# Patient Record
Sex: Female | Born: 1985 | Race: Black or African American | Hispanic: No | Marital: Married | Smoking: Never smoker
Health system: Southern US, Community
[De-identification: ages and names within clinical notes are randomized; demographics above are authoritative.]

---

## 2007-09-28 ENCOUNTER — Inpatient Hospital Stay (HOSPITAL_COMMUNITY): Admission: EM | Admit: 2007-09-28 | Discharge: 2007-10-01 | Payer: Self-pay | Admitting: Emergency Medicine
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
## 2011-01-24 LAB — BASIC METABOLIC PANEL
BUN: 14
Chloride: 103
GFR calc non Af Amer: >60
Glucose, Bld: 116 — ABNORMAL HIGH
Potassium: 3.5
Sodium: 136

## 2011-01-24 LAB — POCT I-STAT, CHEM 8
BUN: 18
Calcium, Ion: 1.09 — ABNORMAL LOW
Chloride: 105
Creatinine, Ser: 1.1
TCO2: 18

## 2011-01-24 LAB — TYPE AND SCREEN: ABO/RH(D): O POS

## 2011-01-24 LAB — CBC
HCT: 35.5 — ABNORMAL LOW
HCT: 39.5
Hemoglobin: 12.1
Hemoglobin: 13.4
MCHC: 33.8
MCV: 93.9
MCV: 94.2
RBC: 4.2
RDW: 13.3
WBC: 14.7 — ABNORMAL HIGH

## 2011-01-24 LAB — URINE MICROSCOPIC-ADD ON

## 2011-01-24 LAB — ETHANOL: Alcohol, Ethyl (B): <5

## 2011-01-24 LAB — URINALYSIS, ROUTINE W REFLEX MICROSCOPIC
Bilirubin Urine: NEGATIVE
Ketones, ur: >80 — AB
Specific Gravity, Urine: 1.015
pH: 5.5

## 2011-01-24 LAB — PROTIME-INR: INR: 1

## 2017-01-05 ENCOUNTER — Encounter (HOSPITAL_COMMUNITY): Payer: Self-pay | Admitting: Emergency Medicine

## 2017-01-05 ENCOUNTER — Ambulatory Visit (HOSPITAL_COMMUNITY)
Admission: EM | Admit: 2017-01-05 | Discharge: 2017-01-05 | Disposition: A | Discharge status: Home / Self Care | Payer: Self-pay | Attending: Family Medicine | Admitting: Family Medicine

## 2017-01-05 DIAGNOSIS — Z3202 Encounter for pregnancy test, result negative: Secondary | ICD-10-CM | POA: Insufficient documentation

## 2017-01-05 DIAGNOSIS — N898 Other specified noninflammatory disorders of vagina: Secondary | ICD-10-CM | POA: Insufficient documentation

## 2017-01-05 DIAGNOSIS — A5901 Trichomonal vulvovaginitis: Secondary | ICD-10-CM | POA: Insufficient documentation

## 2017-01-05 DIAGNOSIS — B9689 Other specified bacterial agents as the cause of diseases classified elsewhere: Secondary | ICD-10-CM | POA: Insufficient documentation

## 2017-01-05 DIAGNOSIS — Z113 Encounter for screening for infections with a predominantly sexual mode of transmission: Secondary | ICD-10-CM

## 2017-01-05 DIAGNOSIS — B373 Candidiasis of vulva and vagina: Secondary | ICD-10-CM | POA: Insufficient documentation

## 2017-01-05 DIAGNOSIS — Z711 Person with feared health complaint in whom no diagnosis is made: Secondary | ICD-10-CM

## 2017-01-05 DIAGNOSIS — R8299 Other abnormal findings in urine: Secondary | ICD-10-CM

## 2017-01-05 LAB — POCT URINALYSIS DIP (DEVICE)
GLUCOSE, UA: NEGATIVE mg/dL
Hgb urine dipstick: NEGATIVE
Ketones, ur: NEGATIVE mg/dL
NITRITE: NEGATIVE
PH: 7 (ref 5.0–8.0)
PROTEIN: 30 mg/dL — AB
Specific Gravity, Urine: 1.02 (ref 1.005–1.030)
UROBILINOGEN UA: 4 mg/dL — AB (ref 0.0–1.0)

## 2017-01-05 LAB — POCT PREGNANCY, URINE: PREG TEST UR: NEGATIVE

## 2017-01-05 MED ORDER — CEFTRIAXONE SODIUM 250 MG IJ SOLR
250.0000 mg | Freq: Once | INTRAMUSCULAR | Status: AC
  Administered 2017-01-05: 250 mg via INTRAMUSCULAR

## 2017-01-05 MED ORDER — AZITHROMYCIN 250 MG PO TABS
1000.0000 mg | ORAL_TABLET | Freq: Once | ORAL | Status: AC
  Administered 2017-01-05: 1000 mg via ORAL

## 2017-01-05 MED ORDER — CEFTRIAXONE SODIUM 250 MG IJ SOLR
INTRAMUSCULAR | Status: AC
  Filled 2017-01-05: qty 250

## 2017-01-05 MED ORDER — LIDOCAINE HCL (PF) 1 % IJ SOLN
INTRAMUSCULAR | Status: AC
  Filled 2017-01-05: qty 2

## 2017-01-05 MED ORDER — AZITHROMYCIN 250 MG PO TABS
ORAL_TABLET | ORAL | Status: AC
  Filled 2017-01-05: qty 4

## 2017-01-05 NOTE — ED Triage Notes (Signed)
Pt c/o vaginal discharge x 2 days ago. Denies any burning or itching. Pt states "i would just like to get a shot that takes care of everything"

## 2017-01-05 NOTE — ED Provider Notes (Addendum)
[facility]    CSN: [id number] Arrival date & time: 01/05/17  1235     History   Chief Complaint Chief Complaint  Patient presents with  . SEXUALLY TRANSMITTED DISEASE    HPI Alexis Summers is a 31 y.o. female.   Presents today for STD testing. She reports vaginal discharge x 2 days that is yellowish and thin. She denies any urinary symptoms. She is sexually active with 1 partner. She would like to be treated empirically for STD. She declines HIV testing today. She declines Pelvic exam and would prefer self-Swab.       History reviewed. No pertinent past medical history.  There are no active problems to display for this patient.   History reviewed. No pertinent surgical history.  OB History    No data available       Home Medications    Prior to Admission medications   Not on File    Family History History reviewed. No pertinent family history.  Social History Social History  Substance Use Topics  . Smoking status: Never Smoker  . Smokeless tobacco: Never Used  . Alcohol use No     Allergies   Patient has no known allergies.   Review of Systems Review of Systems  Constitutional:       See HPI     Physical Exam Triage Vital Signs ED Triage Vitals [01/05/17 1325]  Enc Vitals Group     BP 99/67     Pulse Rate (!) 53     Resp 16     Temp 98.4 F (36.9 C)     Temp Source Oral     SpO2 100 %     Weight      Height      Head Circumference      Peak Flow      Pain Score      Pain Loc      Pain Edu?      Excl. in GC?    No data found.   Updated Vital Signs BP 99/67 (BP Location: Left Arm)   Pulse (!) 53   Temp 98.4 F (36.9 C) (Oral)   Resp 16   LMP 01/02/2017   SpO2 100%   Visual Acuity Right Eye Distance:   Left Eye Distance:   Bilateral Distance:    Right Eye Near:   Left Eye Near:    Bilateral Near:     Physical Exam  Constitutional: She appears well-developed and well-nourished.  Cardiovascular: Normal  rate and regular rhythm.   Pulmonary/Chest: Effort normal and breath sounds normal. She has no wheezes.  Abdominal: Soft. Bowel sounds are normal. There is no tenderness.  Genitourinary:  Genitourinary Comments: Declines  Skin: Skin is warm and dry.  Nursing note and vitals reviewed.    UC Treatments / Results  Labs (all labs ordered are listed, but only abnormal results are displayed) Labs Reviewed  POCT URINALYSIS DIP (DEVICE) - Abnormal; Notable for the following:       Result Value   Bilirubin Urine SMALL (*)    Protein, ur 30 (*)    Urobilinogen, UA 4.0 (*)    Leukocytes, UA TRACE (*)    All other components within normal limits  URINE CULTURE  POCT PREGNANCY, URINE  CERVICOVAGINAL ANCILLARY ONLY    EKG  EKG Interpretation None       Radiology No results found.  Procedures Procedures (including critical care time)  Medications Ordered in UC Medications  azithromycin (ZITHROMAX) tablet 1,000 mg (1,000 mg Oral Given 01/05/17 1347)  cefTRIAXone (ROCEPHIN) injection 250 mg (250 mg Intramuscular Given 01/05/17 1348)     Initial Impression / Assessment and Plan / UC Course  I have reviewed the triage vital signs and the nursing notes.  Pertinent labs & imaging results that were available during my care of the patient were reviewed by me and considered in my medical decision making (see chart for details).     Final Clinical Impressions(s) / UC Diagnoses   Final diagnoses:  Concern about sexually transmitted disease in female without diagnosis  UA has Trace of Leukocytes; she is asymptomatic; doubt UTI. Will send urine for culture.  Cytology pending.  Rocephin 250 mg IM and Azithromycin 1000 mg given today.  She declines HIV.  Education provided.   New Prescriptions There are no discharge medications for this patient.  Controlled Substance Prescriptions [place] Controlled Substance Registry consulted? Not Applicable      Lucia EstelleZheng, Wendel Homeyer, NP 01/05/17 1408

## 2017-01-07 ENCOUNTER — Telehealth (HOSPITAL_COMMUNITY): Payer: Self-pay | Admitting: Internal Medicine

## 2017-01-07 LAB — CERVICOVAGINAL ANCILLARY ONLY
BACTERIAL VAGINITIS: NEGATIVE
Candida vaginitis: POSITIVE — AB
Chlamydia: NEGATIVE
NEISSERIA GONORRHEA: NEGATIVE
Trichomonas: POSITIVE — AB

## 2017-01-07 MED ORDER — FLUCONAZOLE 150 MG PO TABS: 150.0000 mg | ORAL_TABLET | Freq: Once | ORAL | 0 refills | Status: AC

## 2017-01-07 MED ORDER — METRONIDAZOLE 500 MG PO TABS: 2000.0000 mg | ORAL_TABLET | Freq: Once | ORAL | 0 refills | Status: AC

## 2017-01-07 NOTE — Telephone Encounter (Signed)
Clinical staff, please let patient know that test for candida (yeast) was positive.  Rx fluconazole was sent to the pharmacy of record, [facility] [address]. Test for trichomonas was positive also.  Rx metronidazole was also sent to the pharmacy.  Please refrain from sexual intercourse for 7 days to give the medicine time to work.  Sexual partners need to be notified and tested/treated.  Condoms may reduce risk of reinfection.  Recheck or followup with PCP for further evaluation if symptoms are not improving.   LM

## 2017-07-22 ENCOUNTER — Encounter (HOSPITAL_COMMUNITY): Payer: Self-pay | Admitting: Emergency Medicine

## 2017-07-22 ENCOUNTER — Other Ambulatory Visit: Payer: Self-pay

## 2017-07-22 ENCOUNTER — Ambulatory Visit (HOSPITAL_COMMUNITY)
Admission: EM | Admit: 2017-07-22 | Discharge: 2017-07-22 | Disposition: A | Discharge status: Home / Self Care | Payer: Self-pay | Attending: Family Medicine | Admitting: Family Medicine

## 2017-07-22 DIAGNOSIS — S61412A Laceration without foreign body of left hand, initial encounter: Secondary | ICD-10-CM

## 2017-07-22 DIAGNOSIS — S61411A Laceration without foreign body of right hand, initial encounter: Secondary | ICD-10-CM

## 2017-07-22 NOTE — ED Provider Notes (Signed)
  [facility]   [id number] 07/22/17 Arrival Time: 1002  ASSESSMENT & PLAN:  1. Laceration of right hand without foreign body, initial encounter   2. Laceration of left hand without foreign body, initial encounter    Greater than 18 hours since injuries. Steri-strips applied to L thenar laceration and R 3rd finger laceration. Expect healing without complication. Work note give. Declines Td. Wound care instructions given. OTC analgesics as needed.  Reviewed expectations re: course of current medical issues. Questions answered. Outlined signs and symptoms indicating need for more acute intervention. Patient verbalized understanding. After Visit Summary given.   SUBJECTIVE:  Alexis Summers is a 32 y.o. female who presents with lacerations to her R and L hands. Last evening while cutting vegetables. "Knife slipped a couple of times." Unsure when last Td was. Minimal bleeding from wounds. Cleaned/washed at home. Minimal discomfort. No extremity sensation changes or weakness.  Td UTD: Unknown.  ROS: As per HPI.   OBJECTIVE:  Vitals:   07/22/17 1030  BP: 106/67  Pulse: (!) 59  Temp: 98.4 F (36.9 C)  TempSrc: Oral  SpO2: 100%     General appearance: alert; no distress Skin: laceration of L hand; size: approx 1 cm over thenar area; L thumb with FROM and strength; no active bleeding Skin: laceration of R 3rd and 4th distal dorsal fingers; size: approx 0.5 cm each; fingers with FROM and normal strength; no active bleeding Psychological: alert and cooperative; normal mood and affect  No Known Allergies   Social History   Socioeconomic History  . Marital status: Single    Spouse name: Not on file  . Number of children: Not on file  . Years of education: Not on file  . Highest education level: Not on file  Occupational History  . Not on file  Social Needs  . Financial resource strain: Not on file  . Food insecurity:    Worry: Not on file    Inability: Not on  file  . Transportation needs:    Medical: Not on file    Non-medical: Not on file  Tobacco Use  . Smoking status: Never Smoker  . Smokeless tobacco: Never Used  Substance and Sexual Activity  . Alcohol use: No  . Drug use: Not on file  . Sexual activity: Not on file  Lifestyle  . Physical activity:    Days per week: Not on file    Minutes per session: Not on file  . Stress: Not on file  Relationships  . Social connections:    Talks on phone: Not on file    Gets together: Not on file    Attends religious service: Not on file    Active member of club or organization: Not on file    Attends meetings of clubs or organizations: Not on file    Relationship status: Not on file  Other Topics Concern  . Not on file  Social History Narrative  . Not on file         Mardella LaymanHagler, Christyl Osentoski, MD 07/23/17 1030

## 2017-07-22 NOTE — ED Notes (Signed)
Non-adherent bandages applied to left thumb and right hand second and third digit and wrapped with coban. Pt tolerated well.

## 2017-07-22 NOTE — Discharge Instructions (Addendum)
Keep your wound clean and as dry as possible. Do not immerse or soak your wound in water. This means no swimming, washing dishes (unless thick rubber gloves are used), baths, or hot tubs until the steri-strips come off.

## 2017-07-22 NOTE — ED Triage Notes (Signed)
States while cutting vegetables yesterday cut both hands with kitchen knife, last Td, "years ago"

## 2017-12-24 ENCOUNTER — Other Ambulatory Visit: Payer: Self-pay

## 2017-12-24 ENCOUNTER — Encounter (HOSPITAL_COMMUNITY): Payer: Self-pay | Admitting: Emergency Medicine

## 2017-12-24 ENCOUNTER — Ambulatory Visit (HOSPITAL_COMMUNITY)
Admission: EM | Admit: 2017-12-24 | Discharge: 2017-12-24 | Disposition: A | Discharge status: Home / Self Care | Payer: Self-pay | Attending: Family Medicine | Admitting: Family Medicine

## 2017-12-24 DIAGNOSIS — N3090 Cystitis, unspecified without hematuria: Secondary | ICD-10-CM | POA: Insufficient documentation

## 2017-12-24 DIAGNOSIS — Z3202 Encounter for pregnancy test, result negative: Secondary | ICD-10-CM

## 2017-12-24 DIAGNOSIS — Z113 Encounter for screening for infections with a predominantly sexual mode of transmission: Secondary | ICD-10-CM

## 2017-12-24 DIAGNOSIS — Z202 Contact with and (suspected) exposure to infections with a predominantly sexual mode of transmission: Secondary | ICD-10-CM | POA: Insufficient documentation

## 2017-12-24 LAB — POCT URINALYSIS DIP (DEVICE)
Bilirubin Urine: NEGATIVE
GLUCOSE, UA: NEGATIVE mg/dL
KETONES UR: NEGATIVE mg/dL
NITRITE: NEGATIVE
PROTEIN: NEGATIVE mg/dL
Specific Gravity, Urine: 1.02 (ref 1.005–1.030)
Urobilinogen, UA: 0.2 mg/dL (ref 0.0–1.0)
pH: 7 (ref 5.0–8.0)

## 2017-12-24 LAB — POCT PREGNANCY, URINE: PREG TEST UR: NEGATIVE

## 2017-12-24 MED ORDER — LIDOCAINE HCL (PF) 1 % IJ SOLN
INTRAMUSCULAR | Status: AC
  Filled 2017-12-24: qty 2

## 2017-12-24 MED ORDER — AZITHROMYCIN 250 MG PO TABS
ORAL_TABLET | ORAL | Status: AC
  Filled 2017-12-24: qty 4

## 2017-12-24 MED ORDER — CEFTRIAXONE SODIUM 250 MG IJ SOLR
INTRAMUSCULAR | Status: AC
  Filled 2017-12-24: qty 250

## 2017-12-24 MED ORDER — CEPHALEXIN 500 MG PO CAPS: 500.0000 mg | ORAL_CAPSULE | Freq: Two times a day (BID) | ORAL | 0 refills | Status: DC

## 2017-12-24 MED ORDER — CEFTRIAXONE SODIUM 250 MG IJ SOLR
250.0000 mg | Freq: Once | INTRAMUSCULAR | Status: AC
  Administered 2017-12-24: 250 mg via INTRAMUSCULAR

## 2017-12-24 MED ORDER — AZITHROMYCIN 250 MG PO TABS
1000.0000 mg | ORAL_TABLET | Freq: Once | ORAL | Status: AC
  Administered 2017-12-24: 1000 mg via ORAL

## 2017-12-24 NOTE — ED Triage Notes (Signed)
Complains of pressure and is concerned for uti.  Patient says she gets " paranoid"  and concerned for a uti.  History of the same.  This started saturday

## 2017-12-24 NOTE — Discharge Instructions (Signed)
You have been given the following medications today for treatment of suspected gonorrhea and/or chlamydia:  cefTRIAXone (ROCEPHIN) injection 250 mg azithromycin (ZITHROMAX) tablet 1,000 mg  Even though we have treated you today, we have sent testing for sexually transmitted infections. We will notify you of any positive results once they are received. If required, we will prescribe any medications you might need.  Please refrain from all sexual activity for at least the next seven days.  We have also sent a urine culture and will inform you of any positive results.

## 2017-12-25 LAB — CERVICOVAGINAL ANCILLARY ONLY
Bacterial vaginitis: POSITIVE — AB
CHLAMYDIA, DNA PROBE: NEGATIVE
Candida vaginitis: POSITIVE — AB
NEISSERIA GONORRHEA: NEGATIVE
Trichomonas: NEGATIVE

## 2017-12-25 NOTE — ED Provider Notes (Signed)
[facility]    ASSESSMENT & PLAN:  1. Cystitis   2. Possible exposure to STD    Desires STD testing and empiric treatment.  Meds ordered this encounter  Medications  . azithromycin (ZITHROMAX) tablet 1,000 mg  . cefTRIAXone (ROCEPHIN) injection 250 mg  . cephALEXin (KEFLEX) 500 MG capsule    Sig: Take 1 capsule (500 mg total) by mouth 2 (two) times daily.    Dispense:  10 capsule    Refill:  0   Urine culture sent. Will notify patient when results available. Will follow up with her PCP or here if not showing improvement over the next 48 hours, sooner if needed.  Outlined signs and symptoms indicating need for more acute intervention. Patient verbalized understanding. After Visit Summary given.  SUBJECTIVE:  Alexis Summers is a 32 y.o. female who complains of urinary frequency, urgency and dysuria for the past 2 days. No flank pain, fever, chills, abnormal vaginal discharge or bleeding. Hematuria: not present. Normal PO intake. No abdominal pain. No self treatment. Ambulatory without difficulty. Desires STD testing.  LMP: Patient's last menstrual period was 12/18/2017.  ROS: As in HPI.  OBJECTIVE:  Vitals:   12/24/17 1641  BP: 115/67  Pulse: 76  Resp: 16  Temp: 98.2 F (36.8 C)  TempSrc: Oral  SpO2: 100%   Appears well, in no apparent distress. Abdomen is soft without tenderness, guarding, mass, rebound or organomegaly. No CVA tenderness or inguinal adenopathy noted.  Labs Reviewed  POCT URINALYSIS DIP (DEVICE) - Abnormal; Notable for the following components:      Result Value   Hgb urine dipstick SMALL (*)    Leukocytes, UA SMALL (*)    All other components within normal limits  URINE CULTURE  POCT PREGNANCY, URINE  CERVICOVAGINAL ANCILLARY ONLY    No Known Allergies  History reviewed. No pertinent past medical history. Social History   Socioeconomic History  . Marital status: Single    Spouse name: Not on file  . Number of children: Not  on file  . Years of education: Not on file  . Highest education level: Not on file  Occupational History  . Not on file  Social Needs  . Financial resource strain: Not on file  . Food insecurity:    Worry: Not on file    Inability: Not on file  . Transportation needs:    Medical: Not on file    Non-medical: Not on file  Tobacco Use  . Smoking status: Never Smoker  . Smokeless tobacco: Never Used  Substance and Sexual Activity  . Alcohol use: No  . Drug use: Never  . Sexual activity: Not on file  Lifestyle  . Physical activity:    Days per week: Not on file    Minutes per session: Not on file  . Stress: Not on file  Relationships  . Social connections:    Talks on phone: Not on file    Gets together: Not on file    Attends religious service: Not on file    Active member of club or organization: Not on file    Attends meetings of clubs or organizations: Not on file    Relationship status: Not on file  . Intimate partner violence:    Fear of current or ex partner: Not on file    Emotionally abused: Not on file    Physically abused: Not on file    Forced sexual activity: Not on file  Other Topics Concern  .  Not on file  Social History Narrative  . Not on file   Family History  Problem Relation Age of Onset  . Healthy Mother   . Healthy Father        Mardella LaymanHagler, Refael Fulop, MD 12/25/17 1013

## 2017-12-26 ENCOUNTER — Telehealth: Payer: Self-pay | Admitting: Emergency Medicine

## 2017-12-26 ENCOUNTER — Telehealth (HOSPITAL_COMMUNITY): Payer: Self-pay

## 2017-12-26 MED ORDER — METRONIDAZOLE 500 MG PO TABS: 500.0000 mg | ORAL_TABLET | Freq: Two times a day (BID) | ORAL | 0 refills | Status: DC

## 2017-12-26 MED ORDER — FLUCONAZOLE 150 MG PO TABS: 150.0000 mg | ORAL_TABLET | Freq: Every day | ORAL | 0 refills | Status: AC

## 2017-12-26 NOTE — Telephone Encounter (Signed)
Patient returned Alexis Amendourtney, RN's call re: lab results. I advised patient of lab results as per result note. Patient agreed and voiced understanding of results.

## 2017-12-26 NOTE — Telephone Encounter (Signed)
Urine culture positive for gram negative rods, pt was given Keflex. Awaiting sensitivities.   Bacterial vaginosis is positive. This was not treated at the urgent care visit.  Flagyl 500 mg BID x 7 days #14 no refills sent to patients pharmacy of choice.    Pt contacted regarding test for candida (yeast) was positive.  Prescription for fluconazole 150mg  po now, repeat dose in 3d if needed, #2 no refills, sent to the pharmacy of record.    Attempted to reach patient. No answer. Voicemail left.

## 2017-12-27 LAB — URINE CULTURE: Culture: >=100000 — AB

## 2017-12-30 ENCOUNTER — Telehealth: Payer: Self-pay

## 2017-12-30 NOTE — Telephone Encounter (Signed)
Urine culture positive for E.Coli. This was treated at ucc with Keflex. Attempted to reach patient x 2. No answer.

## 2020-06-29 ENCOUNTER — Encounter: Payer: Self-pay | Admitting: Family Medicine

## 2020-08-03 ENCOUNTER — Ambulatory Visit (INDEPENDENT_AMBULATORY_CARE_PROVIDER_SITE_OTHER): Payer: 59 | Admitting: Family Medicine

## 2020-08-03 ENCOUNTER — Encounter: Payer: Self-pay | Admitting: Family Medicine

## 2020-08-03 ENCOUNTER — Other Ambulatory Visit: Payer: Self-pay

## 2020-08-03 ENCOUNTER — Other Ambulatory Visit (HOSPITAL_COMMUNITY)
Admission: RE | Admit: 2020-08-03 | Discharge: 2020-08-03 | Disposition: A | Discharge status: Home / Self Care | Payer: 59 | Source: Ambulatory Visit | Attending: Family Medicine | Admitting: Family Medicine

## 2020-08-03 VITALS — BP 106/65 | HR 59 | Wt 169.0 lb

## 2020-08-03 DIAGNOSIS — N898 Other specified noninflammatory disorders of vagina: Secondary | ICD-10-CM | POA: Diagnosis present

## 2020-08-03 DIAGNOSIS — Z01419 Encounter for gynecological examination (general) (routine) without abnormal findings: Secondary | ICD-10-CM | POA: Diagnosis present

## 2020-08-03 NOTE — Progress Notes (Signed)
GYNECOLOGY ANNUAL PREVENTATIVE CARE ENCOUNTER NOTE  Subjective:   Alexis Summers is a 35 y.o. G0P0000 female here for a routine annual gynecologic exam.  Current complaints: abnormal discharge. Gets vaginal discharge with odor every month or 2.    Denies abnormal vaginal bleeding, discharge, pelvic pain, problems with intercourse or other gynecologic concerns.    Gynecologic History No LMP recorded. Patient has had an injection. Patient is sexually active  Contraception: Depo-Provera injections Last Pap: unsure. Results were: normal Last mammogram: n/a.  Obstetric History OB History  Gravida Para Term Preterm AB Living  0 0 0 0 0 0  SAB IAB Ectopic Multiple Live Births  0 0 0 0 0    History reviewed. No pertinent past medical history.  History reviewed. No pertinent surgical history.  Current Outpatient Medications on File Prior to Visit  Medication Sig Dispense Refill  . citalopram (CELEXA) 40 MG tablet Take 40 mg by mouth daily.    . cephALEXin (KEFLEX) 500 MG capsule Take 1 capsule (500 mg total) by mouth 2 (two) times daily. (Patient not taking: Reported on 08/03/2020) 10 capsule 0  . metroNIDAZOLE (FLAGYL) 500 MG tablet Take 1 tablet (500 mg total) by mouth 2 (two) times daily. (Patient not taking: Reported on 08/03/2020) 14 tablet 0   No current facility-administered medications on file prior to visit.    No Known Allergies  Social History   Socioeconomic History  . Marital status: Single    Spouse name: Not on file  . Number of children: Not on file  . Years of education: Not on file  . Highest education level: Not on file  Occupational History  . Not on file  Tobacco Use  . Smoking status: Never Smoker  . Smokeless tobacco: Never Used  Vaping Use  . Vaping Use: Never used  Substance and Sexual Activity  . Alcohol use: No  . Drug use: Never  . Sexual activity: Not on file  Other Topics Concern  . Not on file  Social History Narrative  . Not on file    Social Determinants of Health   Financial Resource Strain: Not on file  Food Insecurity: Not on file  Transportation Needs: Not on file  Physical Activity: Not on file  Stress: Not on file  Social Connections: Not on file  Intimate Partner Violence: Not on file    Family History  Problem Relation Age of Onset  . Healthy Mother   . Healthy Father     The following portions of the patient's history were reviewed and updated as appropriate: allergies, current medications, past family history, past medical history, past social history, past surgical history and problem list.  Review of Systems Pertinent items are noted in HPI.   Objective:  BP 106/65   Pulse (!) 59   Wt 169 lb (76.7 kg)  Wt Readings from Last 3 Encounters:  08/03/20 169 lb (76.7 kg)     Chaperone present during exam  CONSTITUTIONAL: Well-developed, well-nourished female in no acute distress.  HENT:  Normocephalic, atraumatic, External right and left ear normal. Oropharynx is clear and moist EYES: Conjunctivae and EOM are normal. Pupils are equal, round, and reactive to light. No scleral icterus.  NECK: Normal range of motion, supple, no masses.  Normal thyroid.   CARDIOVASCULAR: Normal heart rate noted, regular rhythm RESPIRATORY: Clear to auscultation bilaterally. Effort and breath sounds normal, no problems with respiration noted. BREASTS: Symmetric in size. No masses, skin changes, nipple drainage, or  lymphadenopathy. ABDOMEN: Soft, normal bowel sounds, no distention noted.  No tenderness, rebound or guarding.  PELVIC: Normal appearing external genitalia; normal appearing vaginal mucosa and cervix.  No abnormal discharge noted.  Normal uterine size, no other palpable masses, no uterine or adnexal tenderness. MUSCULOSKELETAL: Normal range of motion. No tenderness.  No cyanosis, clubbing, or edema.  2+ distal pulses. SKIN: Skin is warm and dry. No rash noted. Not diaphoretic. No erythema. No  pallor. NEUROLOGIC: Alert and oriented to person, place, and time. Normal reflexes, muscle tone coordination. No cranial nerve deficit noted. PSYCHIATRIC: Normal mood and affect. Normal behavior. Normal judgment and thought content.  Assessment:  Annual gynecologic examination with pap smear   Plan:  1. Well Woman Exam Will follow up results of pap smear and manage accordingly. STD testing discussed. Patient requested testing - Cytology - PAP( [place])  2. Vaginal discharge Discussed triggers for BV - tight clothes, soaps, lotions with odor, sex without condoms, etc. May need suppression. Will follow vaginal culture - Cervicovaginal ancillary only( [place])   Routine preventative health maintenance measures emphasized. Please refer to After Visit Summary for other counseling recommendations.    Candelaria Celeste, DO [facility]

## 2020-08-05 LAB — CYTOLOGY - PAP
Comment: NEGATIVE
Diagnosis: NEGATIVE
High risk HPV: NEGATIVE

## 2020-08-05 LAB — CERVICOVAGINAL ANCILLARY ONLY
Bacterial Vaginitis (gardnerella): POSITIVE — AB
Candida Glabrata: NEGATIVE
Candida Vaginitis: NEGATIVE
Comment: NEGATIVE
Comment: NEGATIVE
Comment: NEGATIVE

## 2020-08-08 ENCOUNTER — Telehealth: Payer: Self-pay

## 2020-08-08 MED ORDER — METRONIDAZOLE 500 MG PO TABS: 500.0000 mg | ORAL_TABLET | Freq: Two times a day (BID) | ORAL | 0 refills | Status: DC

## 2020-08-08 NOTE — Telephone Encounter (Signed)
Patient called and received her pap smear results. Patient made aware that she does have trichomonas on her pap smear. Explained this is an STD and she will need to be treated. Explained she needs to avoid alcohol with her treatment and that her partner needs to be tested and /or treated. They must abstain from intercourse until treatment is completed. Patient states understanding. Armandina Stammer RN

## 2020-08-30 ENCOUNTER — Telehealth: Payer: Self-pay | Admitting: General Practice

## 2020-08-30 NOTE — Telephone Encounter (Signed)
Left message on voice mail for patient to give our office a call to schedule 3 month follow up for the month of July 2023.

## 2020-11-29 ENCOUNTER — Ambulatory Visit (HOSPITAL_COMMUNITY): Payer: 59 | Admitting: Clinical

## 2021-07-12 DIAGNOSIS — Z01411 Encounter for gynecological examination (general) (routine) with abnormal findings: Secondary | ICD-10-CM | POA: Diagnosis not present

## 2021-07-12 DIAGNOSIS — R87810 Cervical high risk human papillomavirus (HPV) DNA test positive: Secondary | ICD-10-CM | POA: Diagnosis not present

## 2021-07-12 DIAGNOSIS — N898 Other specified noninflammatory disorders of vagina: Secondary | ICD-10-CM | POA: Diagnosis not present

## 2021-07-12 DIAGNOSIS — Z202 Contact with and (suspected) exposure to infections with a predominantly sexual mode of transmission: Secondary | ICD-10-CM | POA: Diagnosis not present

## 2021-07-12 DIAGNOSIS — Z1151 Encounter for screening for human papillomavirus (HPV): Secondary | ICD-10-CM | POA: Diagnosis not present

## 2021-07-12 DIAGNOSIS — Z124 Encounter for screening for malignant neoplasm of cervix: Secondary | ICD-10-CM | POA: Diagnosis not present

## 2021-07-12 DIAGNOSIS — Z130 Encounter for screening for diseases of the blood and blood-forming organs and certain disorders involving the immune mechanism: Secondary | ICD-10-CM | POA: Diagnosis not present

## 2021-08-01 DIAGNOSIS — Z23 Encounter for immunization: Secondary | ICD-10-CM | POA: Diagnosis not present

## 2021-10-02 DIAGNOSIS — Z23 Encounter for immunization: Secondary | ICD-10-CM | POA: Diagnosis not present

## 2022-01-03 DIAGNOSIS — Z113 Encounter for screening for infections with a predominantly sexual mode of transmission: Secondary | ICD-10-CM | POA: Diagnosis not present

## 2022-01-03 DIAGNOSIS — N765 Ulceration of vagina: Secondary | ICD-10-CM | POA: Diagnosis not present

## 2022-02-12 ENCOUNTER — Encounter: Payer: Self-pay | Admitting: Nurse Practitioner

## 2022-02-12 ENCOUNTER — Ambulatory Visit: Payer: BC Managed Care – PPO | Admitting: Nurse Practitioner

## 2022-02-12 VITALS — BP 100/70 | HR 76 | Temp 98.1°F | Ht 72.0 in | Wt 156.6 lb

## 2022-02-12 DIAGNOSIS — Z23 Encounter for immunization: Secondary | ICD-10-CM

## 2022-02-12 DIAGNOSIS — Z2821 Immunization not carried out because of patient refusal: Secondary | ICD-10-CM

## 2022-02-12 DIAGNOSIS — R200 Anesthesia of skin: Secondary | ICD-10-CM | POA: Diagnosis not present

## 2022-02-12 DIAGNOSIS — Z0000 Encounter for general adult medical examination without abnormal findings: Secondary | ICD-10-CM | POA: Diagnosis not present

## 2022-02-12 DIAGNOSIS — Z114 Encounter for screening for human immunodeficiency virus [HIV]: Secondary | ICD-10-CM

## 2022-02-12 DIAGNOSIS — Z1159 Encounter for screening for other viral diseases: Secondary | ICD-10-CM | POA: Diagnosis not present

## 2022-02-12 DIAGNOSIS — Z0001 Encounter for general adult medical examination with abnormal findings: Secondary | ICD-10-CM | POA: Diagnosis not present

## 2022-02-12 NOTE — Progress Notes (Signed)
I,Tianna Badgett,acting as a Education administrator for [employer], FNP.,have documented all relevant documentation on the behalf of Minette Brine, FNP,as directed by  Minette Brine, FNP while in the presence of Minette Brine, [place].  Subjective:     Patient ID: Alexis Summers , female    DOB: 1985-11-14 , 36 y.o.   MRN: [id number]   Chief Complaint  Patient presents with   Establish Care    HPI  Patient presents today to establish care. She was referred to [place]. She has not had a PCP in more than 5 years. She is followed by Elray Mcgregor at [facility]. She works in Therapist, art. Single. No children.   PMH - none  She would like a physical. She also reports tingling in her hands. Has been ongoing for years.     History reviewed. No pertinent past medical history.   Family History  Problem Relation Age of Onset   Hypertension Mother    Healthy Mother    Healthy Father      Current Outpatient Medications:    cephALEXin (KEFLEX) 500 MG capsule, Take 1 capsule (500 mg total) by mouth 2 (two) times daily. (Patient not taking: Reported on 08/03/2020), Disp: 10 capsule, Rfl: 0   citalopram (CELEXA) 40 MG tablet, Take 40 mg by mouth daily. (Patient not taking: Reported on 02/12/2022), Disp: , Rfl:    metroNIDAZOLE (FLAGYL) 500 MG tablet, Take 1 tablet (500 mg total) by mouth 2 (two) times daily. (Patient not taking: Reported on 08/03/2020), Disp: 14 tablet, Rfl: 0   metroNIDAZOLE (FLAGYL) 500 MG tablet, Take 1 tablet (500 mg total) by mouth 2 (two) times daily. (Patient not taking: Reported on 02/12/2022), Disp: 14 tablet, Rfl: 0   No Known Allergies    The patient states she is none for birth control.  Patient's last menstrual period was 01/25/2022 (approximate).. Negative for Dysmenorrhea and Negative for Menorrhagia. Negative for: breast discharge, breast lump(s), breast pain and breast self exam. Associated symptoms include abnormal vaginal bleeding. Pertinent negatives include  abnormal bleeding (hematology), anxiety, decreased libido, depression, difficulty falling sleep, dyspareunia, history of infertility, nocturia, sexual dysfunction, sleep disturbances, urinary incontinence, urinary urgency, vaginal discharge and vaginal itching. Diet regular.The patient states her exercise level is   The patient's tobacco use is:  Social History   Tobacco Use  Smoking Status Never  Smokeless Tobacco Never  . She has been exposed to passive smoke. The patient's alcohol use is:  Social History   Substance and Sexual Activity  Alcohol Use Yes   Alcohol/week: 2.0 standard drinks of alcohol   Types: 2 Glasses of wine per week   Comment: everyday   Additional information: Last pap 08/03/2020, next one scheduled for 08/04/2023.    Review of Systems  Constitutional: Negative.   HENT: Negative.    Eyes: Negative.   Respiratory: Negative.    Cardiovascular: Negative.   Gastrointestinal: Negative.   Endocrine: Negative.   Genitourinary: Negative.   Musculoskeletal: Negative.   Skin: Negative.   Allergic/Immunologic: Negative.   Neurological:  Positive for numbness (fingertips, random times - mainly when it is cold but has occurred during the warm seasons).  Hematological: Negative.   Psychiatric/Behavioral: Negative.       Today's Vitals   02/12/22 0920  BP: 100/70  Pulse: 76  Temp: 98.1 F (36.7 C)  TempSrc: Oral  Weight: 156 lb 9.6 oz (71 kg)  Height: 6' (1.829 m)   Body mass index is 21.24 kg/m.   Objective:  Physical Exam Vitals reviewed.  Constitutional:      General: She is not in acute distress.    Appearance: Normal appearance. She is well-developed.  HENT:     Head: Normocephalic and atraumatic.     Right Ear: Hearing, tympanic membrane, ear canal and external ear normal. There is no impacted cerumen.     Left Ear: Hearing, tympanic membrane, ear canal and external ear normal. There is no impacted cerumen.     Nose: Nose normal.     Mouth/Throat:      Mouth: Mucous membranes are dry.  Eyes:     General: Lids are normal.     Extraocular Movements: Extraocular movements intact.     Conjunctiva/sclera: Conjunctivae normal.     Pupils: Pupils are equal, round, and reactive to light.     Funduscopic exam:    Right eye: No papilledema.        Left eye: No papilledema.  Neck:     Thyroid: No thyroid mass.     Vascular: No carotid bruit.  Cardiovascular:     Rate and Rhythm: Normal rate and regular rhythm.     Pulses: Normal pulses.     Heart sounds: Normal heart sounds. No murmur heard. Pulmonary:     Effort: Pulmonary effort is normal.     Breath sounds: Normal breath sounds.  Chest:     Chest wall: No mass.  Breasts:    Tanner Score is 5.     Right: Normal. No mass or tenderness.     Left: Normal. No mass or tenderness.  Abdominal:     General: Abdomen is flat. Bowel sounds are normal. There is no distension.     Palpations: Abdomen is soft.     Tenderness: There is no abdominal tenderness.  Genitourinary:    Comments: [facility] by [facility] Musculoskeletal:        General: No swelling or tenderness. Normal range of motion.     Cervical back: Full passive range of motion without pain, normal range of motion and neck supple.     Right lower leg: No edema.     Left lower leg: No edema.  Lymphadenopathy:     Upper Body:     Right upper body: No supraclavicular, axillary or pectoral adenopathy.     Left upper body: No supraclavicular, axillary or pectoral adenopathy.  Skin:    General: Skin is warm and dry.     Capillary Refill: Capillary refill takes less than 2 seconds.  Neurological:     General: No focal deficit present.     Mental Status: She is alert and oriented to person, place, and time.     Cranial Nerves: No cranial nerve deficit.     Sensory: No sensory deficit.     Motor: No weakness.  Psychiatric:        Mood and Affect: Mood normal.        Behavior: Behavior normal.        Thought  Content: Thought content normal.        Judgment: Judgment normal.         Assessment And Plan:     1. Encounter for medical examination to establish care Behavior modifications discussed and diet history reviewed.   Pt will continue to exercise regularly and modify diet with low GI, plant based foods and decrease intake of processed foods.  Recommend intake of daily multivitamin, Vitamin D, and calcium.  Recommend mammogram and colonoscopy for  preventive screenings, as well as recommend immunizations that include influenza (declined), TDAP (given in office) - CBC - Hemoglobin A1c - CMP14+EGFR - Lipid panel - VITAMIN D 25 Hydroxy (Vit-D Deficiency, Fractures)  2. Encounter for hepatitis C screening test for low risk patient Will check Hepatitis C screening due to recent recommendations to screen all adults 18 years and older - Hepatitis C antibody  3. Screening for HIV without presence of risk factors - HIV Antibody (routine testing w rflx)  4. Need for Tdap vaccination Will give tetanus vaccine today while in office. Refer to order management. TDAP will be administered to adults 35-26 years old every 10 years.  5. Influenza vaccination declined Patient declined influenza vaccination at this time. Patient is aware that influenza vaccine prevents illness in 70% of healthy people, and reduces hospitalizations to 30-70% in elderly. This vaccine is recommended annually. Education has been provided regarding the importance of this vaccine but patient still declined. Advised may receive this vaccine at local pharmacy or Health Dept.or vaccine clinic. Aware to provide a copy of the vaccination record if obtained from local pharmacy or Health Dept.  Pt is willing to accept risk associated with refusing vaccination.  6. COVID-19 vaccination declined Declines covid 19 vaccine. Discussed risk of covid 67 and if she changes her mind about the vaccine to call the office. Education has been  provided regarding the importance of this vaccine but patient still declined. Advised may receive this vaccine at local pharmacy or Health Dept.or vaccine clinic. Aware to provide a copy of the vaccination record if obtained from local pharmacy or Health Dept.  Encouraged to take multivitamin, vitamin d, vitamin c and zinc to increase immune system. Aware can call office if would like to have vaccine here at office. Verbalized acceptance and understanding.  5. Numbness Comments: No abnormal findings this visit. DDX - carpal tunnel vs metabolic discorder vs vitamin B12 defieciency vs Raynauds (since has fingertips discoloration with episode).  - Vitamin B12 - TSH   Patient was given opportunity to ask questions. Patient verbalized understanding of the plan and was able to repeat key elements of the plan. All questions were answered to their satisfaction.   Minette Brine, FNP   I, Minette Brine, FNP, have reviewed all documentation for this visit. The documentation on 02/12/22 for the exam, diagnosis, procedures, and orders are all accurate and complete.   THE PATIENT IS ENCOURAGED TO PRACTICE SOCIAL DISTANCING DUE TO THE COVID-19 PANDEMIC.

## 2022-02-12 NOTE — Patient Instructions (Addendum)
Health Maintenance, Female Adopting a healthy lifestyle and getting preventive care are important in promoting health and wellness. Ask your health care provider about: The right schedule for you to have regular tests and exams. Things you can do on your own to prevent diseases and keep yourself healthy. What should I know about diet, weight, and exercise? Eat a healthy diet  Eat a diet that includes plenty of vegetables, fruits, low-fat dairy products, and lean protein. Do not eat a lot of foods that are high in solid fats, added sugars, or sodium. Maintain a healthy weight Body mass index (BMI) is used to identify weight problems. It estimates body fat based on height and weight. Your health care provider can help determine your BMI and help you achieve or maintain a healthy weight. Get regular exercise Get regular exercise. This is one of the most important things you can do for your health. Most adults should: Exercise for at least 150 minutes each week. The exercise should increase your heart rate and make you sweat (moderate-intensity exercise). Do strengthening exercises at least twice a week. This is in addition to the moderate-intensity exercise. Spend less time sitting. Even light physical activity can be beneficial. Watch cholesterol and blood lipids Have your blood tested for lipids and cholesterol at 36 years of age, then have this test every 5 years. Have your cholesterol levels checked more often if: Your lipid or cholesterol levels are high. You are older than 36 years of age. You are at high risk for heart disease. What should I know about cancer screening? Depending on your health history and family history, you may need to have cancer screening at various ages. This may include screening for: Breast cancer. Cervical cancer. Colorectal cancer. Skin cancer. Lung cancer. What should I know about heart disease, diabetes, and high blood pressure? Blood pressure and heart  disease High blood pressure causes heart disease and increases the risk of stroke. This is more likely to develop in people who have high blood pressure readings or are overweight. Have your blood pressure checked: Every 3-5 years if you are 18-39 years of age. Every year if you are 40 years old or older. Diabetes Have regular diabetes screenings. This checks your fasting blood sugar level. Have the screening done: Once every three years after age 40 if you are at a normal weight and have a low risk for diabetes. More often and at a younger age if you are overweight or have a high risk for diabetes. What should I know about preventing infection? Hepatitis B If you have a higher risk for hepatitis B, you should be screened for this virus. Talk with your health care provider to find out if you are at risk for hepatitis B infection. Hepatitis C Testing is recommended for: Everyone born from 1945 through 1965. Anyone with known risk factors for hepatitis C. Sexually transmitted infections (STIs) Get screened for STIs, including gonorrhea and chlamydia, if: You are sexually active and are younger than 36 years of age. You are older than 36 years of age and your health care provider tells you that you are at risk for this type of infection. Your sexual activity has changed since you were last screened, and you are at increased risk for chlamydia or gonorrhea. Ask your health care provider if you are at risk. Ask your health care provider about whether you are at high risk for HIV. Your health care provider may recommend a prescription medicine to help prevent HIV   infection. If you choose to take medicine to prevent HIV, you should first get tested for HIV. You should then be tested every 3 months for as long as you are taking the medicine. Pregnancy If you are about to stop having your period (premenopausal) and you may become pregnant, seek counseling before you get pregnant. Take 400 to 800  micrograms (mcg) of folic acid every day if you become pregnant. Ask for birth control (contraception) if you want to prevent pregnancy. Osteoporosis and menopause Osteoporosis is a disease in which the bones lose minerals and strength with aging. This can result in bone fractures. If you are 65 years old or older, or if you are at risk for osteoporosis and fractures, ask your health care provider if you should: Be screened for bone loss. Take a calcium or vitamin D supplement to lower your risk of fractures. Be given hormone replacement therapy (HRT) to treat symptoms of menopause. Follow these instructions at home: Alcohol use Do not drink alcohol if: Your health care provider tells you not to drink. You are pregnant, may be pregnant, or are planning to become pregnant. If you drink alcohol: Limit how much you have to: 0-1 drink a day. Know how much alcohol is in your drink. In the [place] one drink equals one 12 oz bottle of beer (355 mL), one 5 oz glass of wine (148 mL), or one 1 oz glass of hard liquor (44 mL). Lifestyle Do not use any products that contain nicotine or tobacco. These products include cigarettes, chewing tobacco, and vaping devices, such as e-cigarettes. If you need help quitting, ask your health care provider. Do not use street drugs. Do not share needles. Ask your health care provider for help if you need support or information about quitting drugs. General instructions Schedule regular health, dental, and eye exams. Stay current with your vaccines. Tell your health care provider if: You often feel depressed. You have ever been abused or do not feel safe at home. Summary Adopting a healthy lifestyle and getting preventive care are important in promoting health and wellness. Follow your health care provider's instructions about healthy diet, exercising, and getting tested or screened for diseases. Follow your health care provider's instructions on monitoring your  cholesterol and blood pressure. This information is not intended to replace advice given to you by your health care provider. Make sure you discuss any questions you have with your health care provider. Document Revised: 09/05/2020 Document Reviewed: 09/05/2020 Elsevier Patient Education  2023 Elsevier Inc.   Tdap (Tetanus, Diphtheria, Pertussis) Vaccine: What You Need to Know 1. Why get vaccinated? Tdap vaccine can prevent tetanus, diphtheria, and pertussis. Diphtheria and pertussis spread from person to person. Tetanus enters the body through cuts or wounds. TETANUS (T) causes painful stiffening of the muscles. Tetanus can lead to serious health problems, including being unable to open the mouth, having trouble swallowing and breathing, or death. DIPHTHERIA (D) can lead to difficulty breathing, heart failure, paralysis, or death. PERTUSSIS (aP), also known as "whooping cough," can cause uncontrollable, violent coughing that makes it hard to breathe, eat, or drink. Pertussis can be extremely serious especially in babies and [place] children, causing pneumonia, convulsions, brain damage, or death. In teens and adults, it can cause weight loss, loss of bladder control, passing out, and rib fractures from severe coughing. 2. Tdap vaccine Tdap is only for children 7 years and older, adolescents, and adults.  Adolescents should receive a single dose of Tdap, preferably at age 11   or 12 years. Pregnant people should get a dose of Tdap during every pregnancy, preferably during the early part of the third trimester, to help protect the newborn from pertussis. Infants are most at risk for severe, life-threatening complications from pertussis. Adults who have never received Tdap should get a dose of Tdap. Also, adults should receive a booster dose of either Tdap or Td (a different vaccine that protects against tetanus and diphtheria but not pertussis) every 10 years, or after 5 years in the case of a severe  or dirty wound or burn. Tdap may be given at the same time as other vaccines. 3. Talk with your health care provider Tell your vaccine provider if the person getting the vaccine: Has had an allergic reaction after a previous dose of any vaccine that protects against tetanus, diphtheria, or pertussis, or has any severe, life-threatening allergies Has had a coma, decreased level of consciousness, or prolonged seizures within 7 days after a previous dose of any pertussis vaccine (DTP, DTaP, or Tdap) Has seizures or another nervous system problem Has ever had Guillain-Barr Syndrome (also called "GBS") Has had severe pain or swelling after a previous dose of any vaccine that protects against tetanus or diphtheria In some cases, your health care provider may decide to postpone Tdap vaccination until a future visit. People with minor illnesses, such as a cold, may be vaccinated. People who are moderately or severely ill should usually wait until they recover before getting Tdap vaccine.  Your health care provider can give you more information. 4. Risks of a vaccine reaction Pain, redness, or swelling where the shot was given, mild fever, headache, feeling tired, and nausea, vomiting, diarrhea, or stomachache sometimes happen after Tdap vaccination. People sometimes faint after medical procedures, including vaccination. Tell your provider if you feel dizzy or have vision changes or ringing in the ears.  As with any medicine, there is a very remote chance of a vaccine causing a severe allergic reaction, other serious injury, or death. 5. What if there is a serious problem? An allergic reaction could occur after the vaccinated person leaves the clinic. If you see signs of a severe allergic reaction (hives, swelling of the face and throat, difficulty breathing, a fast heartbeat, dizziness, or weakness), call 9-1-1 and get the person to the nearest hospital. For other signs that concern you, call your health  care provider.  Adverse reactions should be reported to the Vaccine Adverse Event Reporting System (VAERS). Your health care provider will usually file this report, or you can do it yourself. Visit the VAERS website at www.vaers.SamedayNews.es or call 442-324-1153. VAERS is only for reporting reactions, and VAERS staff members do not give medical advice. 6. The National Vaccine Injury Compensation Program The [employer] Vaccine Injury Compensation Program (VICP) is a federal program that was created to compensate people who may have been injured by certain vaccines. Claims regarding alleged injury or death due to vaccination have a time limit for filing, which may be as short as two years. Visit the VICP website at GoldCloset.com.ee or call (612)631-7084 to learn about the program and about filing a claim. 7. How can I learn more? Ask your health care provider. Call your local or state health department. Visit the website of the Food and Drug Administration (FDA) for vaccine package inserts and additional information at TraderRating.uy. Contact the Centers for Disease Control and Prevention (CDC): Call 337-380-7140 (1-800-CDC-INFO) or Visit CDC's website at http://hunter.com/. Source: CDC Vaccine Information Statement Tdap (Tetanus, Diphtheria, Pertussis)  Vaccine (12/04/2019) This same material is available at FootballExhibition.com.br for no charge. This information is not intended to replace advice given to you by your health care provider. Make sure you discuss any questions you have with your health care provider. Document Revised: 03/15/2021 Document Reviewed: 01/16/2021 Elsevier Patient Education  2023 Elsevier Inc.   Patient is aware that influenza vaccine prevents illness in 70% of healthy people, and reduces hospitalizations to 30-70% in elderly. This vaccine is recommended annually. Education has been provided regarding the importance of this vaccine but  patient still declined. Advised may receive this vaccine at local pharmacy or Health Dept.or vaccine clinic. Aware to provide a copy of the vaccination record if obtained from local pharmacy or Health Dept.  Pt is willing to accept risk associated with refusing vaccination.  Discussed risk of covid 54 and if she changes her mind about the vaccine to call the office. Education has been provided regarding the importance of this vaccine but patient still declined. Advised may receive this vaccine at local pharmacy or Health Dept.or vaccine clinic. Aware to provide a copy of the vaccination record if obtained from local pharmacy or Health Dept.  Encouraged to take multivitamin, vitamin d, vitamin c and zinc to increase immune system. Aware can call office if would like to have vaccine here at office. Verbalized acceptance and understanding.

## 2022-02-13 LAB — CMP14+EGFR
ALT: 13 IU/L (ref 0–32)
AST: 13 IU/L (ref 0–40)
Albumin/Globulin Ratio: 2.2 (ref 1.2–2.2)
Albumin: 4.7 g/dL (ref 3.9–4.9)
Alkaline Phosphatase: 60 IU/L (ref 44–121)
BUN/Creatinine Ratio: 19 (ref 9–23)
BUN: 16 mg/dL (ref 6–20)
Bilirubin Total: 1.3 mg/dL — ABNORMAL HIGH (ref 0.0–1.2)
CO2: 22 mmol/L (ref 20–29)
Calcium: 9.4 mg/dL (ref 8.7–10.2)
Chloride: 105 mmol/L (ref 96–106)
Creatinine, Ser: 0.85 mg/dL (ref 0.57–1.00)
Globulin, Total: 2.1 g/dL (ref 1.5–4.5)
Glucose: 93 mg/dL (ref 70–99)
Potassium: 4.6 mmol/L (ref 3.5–5.2)
Sodium: 141 mmol/L (ref 134–144)
Total Protein: 6.8 g/dL (ref 6.0–8.5)
eGFR: 91 mL/min/{1.73_m2} (ref >59)

## 2022-02-13 LAB — VITAMIN D 25 HYDROXY (VIT D DEFICIENCY, FRACTURES): Vit D, 25-Hydroxy: 14.6 ng/mL — ABNORMAL LOW (ref 30.0–100.0)

## 2022-02-13 LAB — HIV ANTIBODY (ROUTINE TESTING W REFLEX): HIV Screen 4th Generation wRfx: NONREACTIVE

## 2022-02-13 LAB — CBC
Hematocrit: 39.4 % (ref 34.0–46.6)
Hemoglobin: 12.8 g/dL (ref 11.1–15.9)
MCH: 31.3 pg (ref 26.6–33.0)
MCHC: 32.5 g/dL (ref 31.5–35.7)
MCV: 96 fL (ref 79–97)
Platelets: 261 10*3/uL (ref 150–450)
RBC: 4.09 x10E6/uL (ref 3.77–5.28)
RDW: 11.8 % (ref 11.7–15.4)
WBC: 6.5 10*3/uL (ref 3.4–10.8)

## 2022-02-13 LAB — LIPID PANEL
Chol/HDL Ratio: 2.2 ratio (ref 0.0–4.4)
Cholesterol, Total: 142 mg/dL (ref 100–199)
HDL: 65 mg/dL (ref >39)
LDL Chol Calc (NIH): 68 mg/dL (ref 0–99)
Triglycerides: 36 mg/dL (ref 0–149)
VLDL Cholesterol Cal: 9 mg/dL (ref 5–40)

## 2022-02-13 LAB — VITAMIN B12: Vitamin B-12: 507 pg/mL (ref 232–1245)

## 2022-02-13 LAB — HEPATITIS C ANTIBODY: Hep C Virus Ab: NONREACTIVE

## 2022-02-13 LAB — HEMOGLOBIN A1C
Est. average glucose Bld gHb Est-mCnc: 100 mg/dL
Hgb A1c MFr Bld: 5.1 % (ref 4.8–5.6)

## 2022-02-13 LAB — TSH: TSH: 0.508 u[IU]/mL (ref 0.450–4.500)

## 2022-04-03 ENCOUNTER — Ambulatory Visit: Payer: BC Managed Care – PPO | Admitting: Nurse Practitioner

## 2022-04-03 ENCOUNTER — Encounter: Payer: Self-pay | Admitting: Nurse Practitioner

## 2022-04-03 VITALS — BP 122/72 | HR 90 | Temp 98.1°F | Ht 72.0 in | Wt 155.0 lb

## 2022-04-03 DIAGNOSIS — U071 COVID-19: Secondary | ICD-10-CM

## 2022-04-03 DIAGNOSIS — R070 Pain in throat: Secondary | ICD-10-CM

## 2022-04-03 DIAGNOSIS — R0989 Other specified symptoms and signs involving the circulatory and respiratory systems: Secondary | ICD-10-CM | POA: Diagnosis not present

## 2022-04-03 DIAGNOSIS — R52 Pain, unspecified: Secondary | ICD-10-CM

## 2022-04-03 LAB — POC COVID19 BINAXNOW: SARS Coronavirus 2 Ag: POSITIVE — AB

## 2022-04-03 LAB — POCT INFLUENZA A/B
Influenza A, POC: NEGATIVE
Influenza B, POC: NEGATIVE

## 2022-04-03 MED ORDER — MOLNUPIRAVIR 200 MG PO CAPS: 4.0000 | ORAL_CAPSULE | Freq: Two times a day (BID) | ORAL | 0 refills | Status: AC

## 2022-04-03 NOTE — Progress Notes (Signed)
I,Alexis Summers,acting as a Neurosurgeon for Alexis Felts, FNP.,have documented all relevant documentation on the behalf of Alexis Felts, FNP,as directed by  Alexis Felts, FNP while in the presence of Alexis Felts, FNP.    Subjective:     Patient ID: Alexis Summers , female    DOB: 1986/03/13 , 36 y.o.   MRN: [id number]   Chief Complaint  Patient presents with   URI    HPI  Patient presents today with flu like symptoms body aches, scratchy throat, slight, cough and runny nose.   Initially started on Monday. She states she cold not move that morning, her body felt really heavy & weak.   At night she has broke into sweats.   She has taken ibuprofren every 4 hours since Monday. Chills but had not taken her temp. Body feels hot. She has a decreased appetite. She is a 7 at night.      History reviewed. No pertinent past medical history.   Family History  Problem Relation Age of Onset   Hypertension Mother    Healthy Mother    Healthy Father      Current Outpatient Medications:    cephALEXin (KEFLEX) 500 MG capsule, Take 1 capsule (500 mg total) by mouth 2 (two) times daily. (Patient not taking: Reported on 08/03/2020), Disp: 10 capsule, Rfl: 0   citalopram (CELEXA) 40 MG tablet, Take 40 mg by mouth daily. (Patient not taking: Reported on 02/12/2022), Disp: , Rfl:    metroNIDAZOLE (FLAGYL) 500 MG tablet, Take 1 tablet (500 mg total) by mouth 2 (two) times daily. (Patient not taking: Reported on 08/03/2020), Disp: 14 tablet, Rfl: 0   metroNIDAZOLE (FLAGYL) 500 MG tablet, Take 1 tablet (500 mg total) by mouth 2 (two) times daily. (Patient not taking: Reported on 02/12/2022), Disp: 14 tablet, Rfl: 0   No Known Allergies   Review of Systems  Constitutional: Negative.   Respiratory: Negative.    Cardiovascular: Negative.   Neurological: Negative.   Psychiatric/Behavioral: Negative.       Today's Vitals   04/03/22 1618  BP: 122/72  Pulse: 90  Temp: 98.1 F (36.7 C)  SpO2: 98%   Weight: 155 lb (70.3 kg)  Height: 6' (1.829 m)   Body mass index is 21.02 kg/m.  Wt Readings from Last 3 Encounters:  04/03/22 155 lb (70.3 kg)  02/12/22 156 lb 9.6 oz (71 kg)  08/03/20 169 lb (76.7 kg)    Objective:  Physical Exam Vitals reviewed.  Constitutional:      General: She is not in acute distress.    Appearance: Normal appearance.  Cardiovascular:     Rate and Rhythm: Normal rate and regular rhythm.     Pulses: Normal pulses.     Heart sounds: Normal heart sounds. No murmur heard. Pulmonary:     Effort: Pulmonary effort is normal. No respiratory distress.     Breath sounds: Normal breath sounds. No wheezing.  Skin:    General: Skin is warm and dry.     Capillary Refill: Capillary refill takes less than 2 seconds.  Neurological:     General: No focal deficit present.     Mental Status: She is alert and oriented to person, place, and time.     Cranial Nerves: No cranial nerve deficit.     Motor: No weakness.  Psychiatric:        Mood and Affect: Mood normal.        Behavior: Behavior normal.  Thought Content: Thought content normal.        Judgment: Judgment normal.         Assessment And Plan:     1. Body aches Comments: Treat symptomatically with tylenol/ibuprofen - POCT Influenza A/B - POC COVID-19 BinaxNow  2. Runny nose - POCT Influenza A/B - POC COVID-19 BinaxNow  3. Throat discomfort  4. COVID-19 Comments: Rapid covid is positive will treat with molnupavir. Advised patient to take Vitamin C, D, Zinc.  Keep yourself hydrated with a lot of water and rest. Take Delsym for cough and Mucinex as need. Take Tylenol or pain reliever every 4-6 hours as needed for pain/fever/body ache. If you have elevated blood pressure, you can take OTC Corcidin. You can also take OTC oscillococcinum to help with your symptoms.  Educated patient if symptoms get worse or if she experiences any SOB, chest pain or pain in her legs to seek immediate emergency care.  Continue to monitor your oxygen levels. Call us if you have any questions. Quarantine for 5 days if tested positive and no symptoms or 10 days if tested positive and have symptoms. Wear a mask around other people.  - molnupiravir EUA (LAGEVRIO) 200 MG CAPS capsule; Take 4 capsules (800 mg total) by mouth 2 (two) times daily for 5 days.  Dispense: 40 capsule; Refill: 0     Patient was given opportunity to ask questions. Patient verbalized understanding of the plan and was able to repeat key elements of the plan. All questions were answered to their satisfaction.  Alexis Felts, FNP   I, Alexis Felts, FNP, have reviewed all documentation for this visit. The documentation on 04/03/22 for the exam, diagnosis, procedures, and orders are all accurate and complete.   IF YOU HAVE BEEN REFERRED TO A SPECIALIST, IT MAY TAKE 1-2 WEEKS TO SCHEDULE/PROCESS THE REFERRAL. IF YOU HAVE NOT HEARD FROM US/SPECIALIST IN TWO WEEKS, PLEASE GIVE [place] A CALL AT [phone].   THE PATIENT IS ENCOURAGED TO PRACTICE SOCIAL DISTANCING DUE TO THE COVID-19 PANDEMIC.

## 2022-04-03 NOTE — Patient Instructions (Addendum)
COVID-19 COVID-19, or coronavirus disease 2019, is an infection that is caused by a new (novel) coronavirus called SARS-CoV-2. COVID-19 can cause many symptoms. In some people, the virus may not cause any symptoms. In others, it may cause mild or severe symptoms. Some people with severe infection develop severe disease. What are the causes? This illness is caused by a virus. The virus may be in the air as tiny specks of fluid (aerosols) or droplets, or it may be on surfaces. You may catch the virus by: Breathing in droplets from an infected person. Droplets can be spread by a person breathing, speaking, singing, coughing, or sneezing. Touching something, like a table or a doorknob, that has virus on it (is contaminated) and then touching your mouth, nose, or eyes. What increases the risk? Risk for infection: You are more likely to get infected with the COVID-19 virus if: You are within 6 ft (1.8 m) of a person with COVID-19 for 15 minutes or longer. You are providing care for a person who is infected with COVID-19. You are in close personal contact with other people. Close personal contact includes hugging, kissing, or sharing eating or drinking utensils. Risk for serious illness caused by COVID-19: You are more likely to get seriously ill from the COVID-19 virus if: You have cancer. You have a long-term (chronic) disease, such as: Chronic lung disease. This includes pulmonary embolism, chronic obstructive pulmonary disease, and cystic fibrosis. Long-term disease that lowers your body's ability to fight infection (immunocompromise). Serious cardiac conditions, such as heart failure, coronary artery disease, or cardiomyopathy. Diabetes. Chronic kidney disease. Liver diseases. These include cirrhosis, nonalcoholic fatty liver disease, alcoholic liver disease, or autoimmune hepatitis. You have obesity. You are pregnant or were recently pregnant. You have sickle cell disease. What are the signs  or symptoms? Symptoms of this condition can range from mild to severe. Symptoms may appear any time from 2 to 14 days after being exposed to the virus. They include: Fever or chills. Shortness of breath or trouble breathing. Feeling tired or very tired. Headaches, body aches, or muscle aches. Runny or stuffy nose, sneezing, coughing, or sore throat. New loss of taste or smell. This is rare. Some people may also have stomach problems, such as nausea, vomiting, or diarrhea. Other people may not have any symptoms of COVID-19. How is this diagnosed? This condition may be diagnosed by testing samples to check for the COVID-19 virus. The most common tests are the PCR test and the antigen test. Tests may be done in the lab or at home. They include: Using a swab to take a sample of fluid from the back of your nose and throat (nasopharyngeal fluid), from your nose, or from your throat. Testing a sample of saliva from your mouth. Testing a sample of coughed-up mucus from your lungs (sputum). How is this treated? Treatment for COVID-19 infection depends on the severity of the condition. Mild symptoms can be managed at home with rest, fluids, and over-the-counter medicines. Serious symptoms may be treated in a hospital intensive care unit (ICU). Treatment in the ICU may include: Supplemental oxygen. Extra oxygen is given through a tube in the nose, a face mask, or a hood. Medicines. These may include: Antivirals, such as monoclonal antibodies. These help your body fight off certain viruses that can cause disease. Anti-inflammatories, such as corticosteroids. These reduce inflammation and suppress the immune system. Antithrombotics. These prevent or treat blood clots, if they develop. Convalescent plasma. This helps boost your immune system, if   you have an underlying immunosuppressive condition or are getting immunosuppressive treatments. Prone positioning. This means you will lie on your stomach. This  helps oxygen to get into your lungs. Infection control measures. If you are at risk for more serious illness caused by COVID-19, your health care provider may prescribe two long-acting monoclonal antibodies, given together every 6 months. How is this prevented? To protect yourself: Use preventive medicine (pre-exposure prophylaxis). You may get pre-exposure prophylaxis if you have moderate or severe immunocompromise. Get vaccinated. Anyone 6 months old or older who meets guidelines can get a COVID-19 vaccine or vaccine series. This includes people who are pregnant or making breast milk (lactating). Get an added dose of COVID-19 vaccine after your first vaccine or vaccine series if you have moderate to severe immunocompromise. This applies if you have had a solid organ transplant or have been diagnosed with an immunocompromising condition. You should get the added dose 4 weeks after you got the first COVID-19 vaccine or vaccine series. If you get an mRNA vaccine, you will need a 3-dose primary series. If you get the J&J/Janssen vaccine, you will need a 2-dose primary series, with the second dose being an mRNA vaccine. Talk to your health care provider about getting experimental monoclonal antibodies. This treatment is approved under emergency use authorization to prevent severe illness before or after being exposed to the COVID-19 virus. You may be given monoclonal antibodies if: You have moderate or severe immunocompromise. This includes treatments that lower your immune response. People with immunocompromise may not develop protection against COVID-19 when they are vaccinated. You cannot be vaccinated. You may not get a vaccine if you have a severe allergic reaction to the vaccine or its components. You are not fully vaccinated. You are in a facility where COVID-19 is present and: Are in close contact with a person who is infected with the COVID-19 virus. Are at high risk of being exposed to the  COVID-19 virus. You are at risk of illness from new variants of the COVID-19 virus. To protect others: If you have symptoms of COVID-19, take steps to prevent the virus from spreading to others. Stay home. Leave your house only to get medical care. Do not use public transit, if possible. Do not travel while you are sick. Wash your hands often with soap and water for at least 20 seconds. If soap and water are not available, use alcohol-based hand sanitizer. Make sure that all people in your household wash their hands well and often. Cough or sneeze into a tissue or your sleeve or elbow. Do not cough or sneeze into your hand or into the air. Where to find more information Centers for Disease Control and Prevention: www.cdc.gov/coronavirus World Health Organization: www.who.int/health-topics/coronavirus Get help right away if: You have trouble breathing. You have pain or pressure in your chest. You are confused. You have bluish lips and fingernails. You have trouble waking from sleep. You have symptoms that get worse. These symptoms may be an emergency. Get help right away. Call 911. Do not wait to see if the symptoms will go away. Do not drive yourself to the hospital. Summary COVID-19 is an infection that is caused by a new coronavirus. Sometimes, there are no symptoms. Other times, symptoms range from mild to severe. Some people with a severe COVID-19 infection develop severe disease. The virus that causes COVID-19 can spread from person to person through droplets or aerosols from breathing, speaking, singing, coughing, or sneezing. Mild symptoms of COVID-19 can be managed   at home with rest, fluids, and over-the-counter medicines. This information is not intended to replace advice given to you by your health care provider. Make sure you discuss any questions you have with your health care provider. Document Revised: 04/04/2021 Document Reviewed: 04/06/2021 [employer] Patient Education   2023 [employer]   take Vitamin C, D, Zinc.  Keep yourself hydrated with a lot of water and rest. Take Delsym for cough and Mucinex as need. Take Tylenol or pain reliever every 4-6 hours as needed for pain/fever/body ache. If you have elevated blood pressure, you can take OTC Corcidin. You can also take OTC oscillococcinum to help with your symptoms.  Educated patient if symptoms get worse or if she experiences any SOB, chest pain or pain in her legs to seek immediate emergency care. Continue to monitor your oxygen levels. Call us if you have any questions. Quarantine for 5 days if tested positive and no symptoms or 10 days if tested positive and have symptoms. Wear a mask around other people.

## 2022-04-18 ENCOUNTER — Encounter: Payer: Self-pay | Admitting: Nurse Practitioner

## 2022-06-20 ENCOUNTER — Ambulatory Visit: Payer: BC Managed Care – PPO | Admitting: Nurse Practitioner

## 2022-07-26 ENCOUNTER — Encounter: Payer: Self-pay | Admitting: Nurse Practitioner
